# Patient Record
Sex: Male | Born: 1990 | Race: White | Hispanic: No | Marital: Married | Smoking: Current every day smoker
Health system: Southern US, Community
[De-identification: ages and names within clinical notes are randomized; demographics above are authoritative.]

## PROBLEM LIST (undated history)

## (undated) DIAGNOSIS — F319 Bipolar disorder, unspecified: Secondary | ICD-10-CM

---

## 2016-07-24 ENCOUNTER — Emergency Department (HOSPITAL_COMMUNITY)
Admission: EM | Admit: 2016-07-24 | Discharge: 2016-07-24 | Disposition: A | Discharge status: Home / Self Care | Payer: Managed Care, Other (non HMO) | Attending: Emergency Medicine | Admitting: Emergency Medicine

## 2016-07-24 ENCOUNTER — Encounter (HOSPITAL_COMMUNITY): Payer: Self-pay | Admitting: Emergency Medicine

## 2016-07-24 ENCOUNTER — Emergency Department (HOSPITAL_COMMUNITY): Payer: Managed Care, Other (non HMO)

## 2016-07-24 DIAGNOSIS — F17200 Nicotine dependence, unspecified, uncomplicated: Secondary | ICD-10-CM | POA: Insufficient documentation

## 2016-07-24 DIAGNOSIS — R079 Chest pain, unspecified: Secondary | ICD-10-CM | POA: Diagnosis not present

## 2016-07-24 DIAGNOSIS — R000 Tachycardia, unspecified: Secondary | ICD-10-CM

## 2016-07-24 HISTORY — DX: Bipolar disorder, unspecified: F31.9

## 2016-07-24 LAB — BASIC METABOLIC PANEL
ANION GAP: 8 (ref 5–15)
BUN: 15 mg/dL (ref 6–20)
CALCIUM: 9.9 mg/dL (ref 8.9–10.3)
CO2: 27 mmol/L (ref 22–32)
CREATININE: 1.47 mg/dL — AB (ref 0.61–1.24)
Chloride: 103 mmol/L (ref 101–111)
Glucose, Bld: 96 mg/dL (ref 65–99)
Potassium: 4.1 mmol/L (ref 3.5–5.1)
SODIUM: 138 mmol/L (ref 135–145)

## 2016-07-24 LAB — CBC
HCT: 45 % (ref 39.0–52.0)
HEMOGLOBIN: 15.5 g/dL (ref 13.0–17.0)
MCH: 31.2 pg (ref 26.0–34.0)
MCHC: 34.4 g/dL (ref 30.0–36.0)
MCV: 90.5 fL (ref 78.0–100.0)
PLATELETS: 172 10*3/uL (ref 150–400)
RBC: 4.97 MIL/uL (ref 4.22–5.81)
RDW: 12.7 % (ref 11.5–15.5)
WBC: 9.1 10*3/uL (ref 4.0–10.5)

## 2016-07-24 LAB — I-STAT TROPONIN, ED: TROPONIN I, POC: 0 ng/mL (ref 0.00–0.08)

## 2016-07-24 LAB — TROPONIN I: Troponin I: <0.03 ng/mL (ref <0.03)

## 2016-07-24 LAB — D-DIMER, QUANTITATIVE (NOT AT ARMC)

## 2016-07-24 MED ORDER — SODIUM CHLORIDE 0.9 % IV BOLUS (SEPSIS)
1000.0000 mL | Freq: Once | INTRAVENOUS | Status: AC
  Administered 2016-07-24: 1000 mL via INTRAVENOUS

## 2016-07-24 NOTE — ED Provider Notes (Signed)
I saw and evaluated the patient, reviewed the resident's note and I agree with the findings and plan.  Pertinent History: The patient is a 25 year old male, he has a history of anxiety, cocaine use and daily marijuana use, presents with chest pain which is been going on for the last 3 days, left-sided, seems to get better when he takes a deep breath worse when he positions himself in different positions, not associated with any other symptoms.  Pertinent Exam findings: On exam the patient is well-appearing, no distress, pulse of 70 when I walk into the room but within 30 seconds the patient's heart rate goes up to 120 in a sinus tachycardia, he states that he does have some underlying anxiety, abdomen is soft, no peripheral edema, well-appearing otherwise  I was personally present and directly supervised the following procedures:  I personally interpreted the EKG as well as the resident and agree with the interpretation on the resident's chart.   EKG Interpretation  Date/Time:  Friday July 24 2016 15:02:55 EDT Ventricular Rate:  134 PR Interval:  112 QRS Duration: 78 QT Interval:  298 QTC Calculation: 445 R Axis:   59 Text Interpretation:  Sinus tachycardia Otherwise normal ECG since last tracing no significant change Confirmed by Kerron Sedano  MD, Tranell Wojtkiewicz ([id number]) on 07/24/2016 7:49:53 PM        Final diagnoses:  Tachycardia  Nonspecific chest pain      Eber HongBrian Neema Fluegge, MD 07/25/16 1031

## 2016-07-24 NOTE — ED Triage Notes (Addendum)
Went to [facility]  Because he had high heart rate and bp  Sent for further tests and he had cp

## 2016-07-24 NOTE — ED Provider Notes (Signed)
[facility]-EMERGENCY DEPT Provider Note   CSN: [id number] Arrival date & time: 07/24/16  1452  First Provider Contact:  None       History   Chief Complaint Chief Complaint  Patient presents with  . Tachycardia  . Chest Pain    HPI Randall HissMarcus Croslin is a 25 y.o. male.  HPI Patient has history of bipolar on Seroquel presents for tachycardia. He reports some left-sided chest pain, sharp in nature, worse with deep breath over the past 4 days. It's off-and-on. It is nonexertional. There is some associated shortness of breath. Pain is currently a 2 out of 10. It's worse with pressing on the chest.  He went to urgent care today for the pain, was sent here due to tachycardia. He does report a history of cocaine use, last used one week ago, nightly marijuana use, daily cigarette use.  Past Medical History:  Diagnosis Date  . Bipolar 1 disorder (HCC)     There are no active problems to display for this patient.   History reviewed. No pertinent surgical history.     Home Medications    Prior to Admission medications   Medication Sig Start Date End Date Taking? Authorizing Provider  QUEtiapine (SEROQUEL XR) 400 MG 24 hr tablet Take 800 mg by mouth at bedtime. 07/22/16  Yes Historical Provider, MD    Family History No family history on file.  Social History Social History  Substance Use Topics  . Smoking status: Current Every Day Smoker  . Smokeless tobacco: Never Used  . Alcohol use Not on file     Allergies   Review of patient's allergies indicates no known allergies.   Review of Systems Review of Systems  Constitutional: Negative for chills and fever.  HENT: Negative for ear pain and sore throat.   Eyes: Negative for pain and visual disturbance.  Respiratory: Positive for shortness of breath. Negative for cough.   Cardiovascular: Positive for chest pain. Negative for palpitations and leg swelling.  Gastrointestinal: Negative for abdominal pain and vomiting.    Genitourinary: Negative for dysuria and hematuria.  Musculoskeletal: Negative for arthralgias and back pain.  Skin: Negative for color change and rash.  Neurological: Negative for seizures and syncope.  All other systems reviewed and are negative.    Physical Exam Updated Vital Signs BP 131/95   Pulse 93   Temp 99.5 F (37.5 C) (Oral)   Resp 16   SpO2 99%   Physical Exam  Constitutional: He appears well-developed and well-nourished.  HENT:  Head: Normocephalic and atraumatic.  Eyes: Conjunctivae are normal.  Neck: Neck supple.  Cardiovascular: Regular rhythm.  Tachycardia present.   No murmur heard. Pulmonary/Chest: Effort normal and breath sounds normal. No respiratory distress.  Abdominal: Soft. There is no tenderness.  Musculoskeletal: He exhibits no edema.  Neurological: He is alert.  Skin: Skin is warm and dry.  Psychiatric: He has a normal mood and affect.  Nursing note and vitals reviewed.    ED Treatments / Results  Labs (all labs ordered are listed, but only abnormal results are displayed) Labs Reviewed  BASIC METABOLIC PANEL - Abnormal; Notable for the following:       Result Value   Creatinine, Ser 1.47 (*)    All other components within normal limits  CBC  TROPONIN I  I-STAT TROPOININ, ED    EKG  EKG Interpretation None       Radiology Dg Chest 2 View  Result Date: 07/24/2016 CLINICAL DATA:  Left-sided chest pain  EXAM: CHEST  2 VIEW COMPARISON:  None. FINDINGS: The heart size and mediastinal contours are within normal limits. Both lungs are clear. The visualized skeletal structures are unremarkable. IMPRESSION: No active cardiopulmonary disease. Electronically Signed   By: Alcide Clever M.D.   On: 07/24/2016 15:42    Procedures Procedures (including critical care time)  Medications Ordered in ED Medications - No data to display   Initial Impression / Assessment and Plan / ED Course  I have reviewed the triage vital signs and the  nursing notes.  Pertinent labs & imaging results that were available during my care of the patient were reviewed by me and considered in my medical decision making (see chart for details).  Clinical Course    Patient presents for evaluation of chest pain.  HEART Pathway evaluation is low risk.  Troponin negative x2.  EKG is NSR.  Feel like PNA unlikely given CXR without infiltrates or effusion, no leukocytosis, no fever.  No mediastinal widening on CXR.  Doubt emergent intraabdominal pathology given benign abdominal exam and no abdominal symptoms.  Low risk Wells and d-dimer negative.   Impression is nonspecific chest pain  Final Clinical Impressions(s) / ED Diagnoses   Final diagnoses:  None    New Prescriptions New Prescriptions   No medications on file     Marcelina Morel, MD 07/24/16 2256    Eber Hong, MD 07/25/16 1031

## 2017-08-13 IMAGING — DX DG CHEST 2V
2 series · 2 of 2 positions shown · non-contrast
Comparison: None.

CLINICAL DATA: Left-sided chest pain

EXAM:
CHEST  2 VIEW

[chest pa]
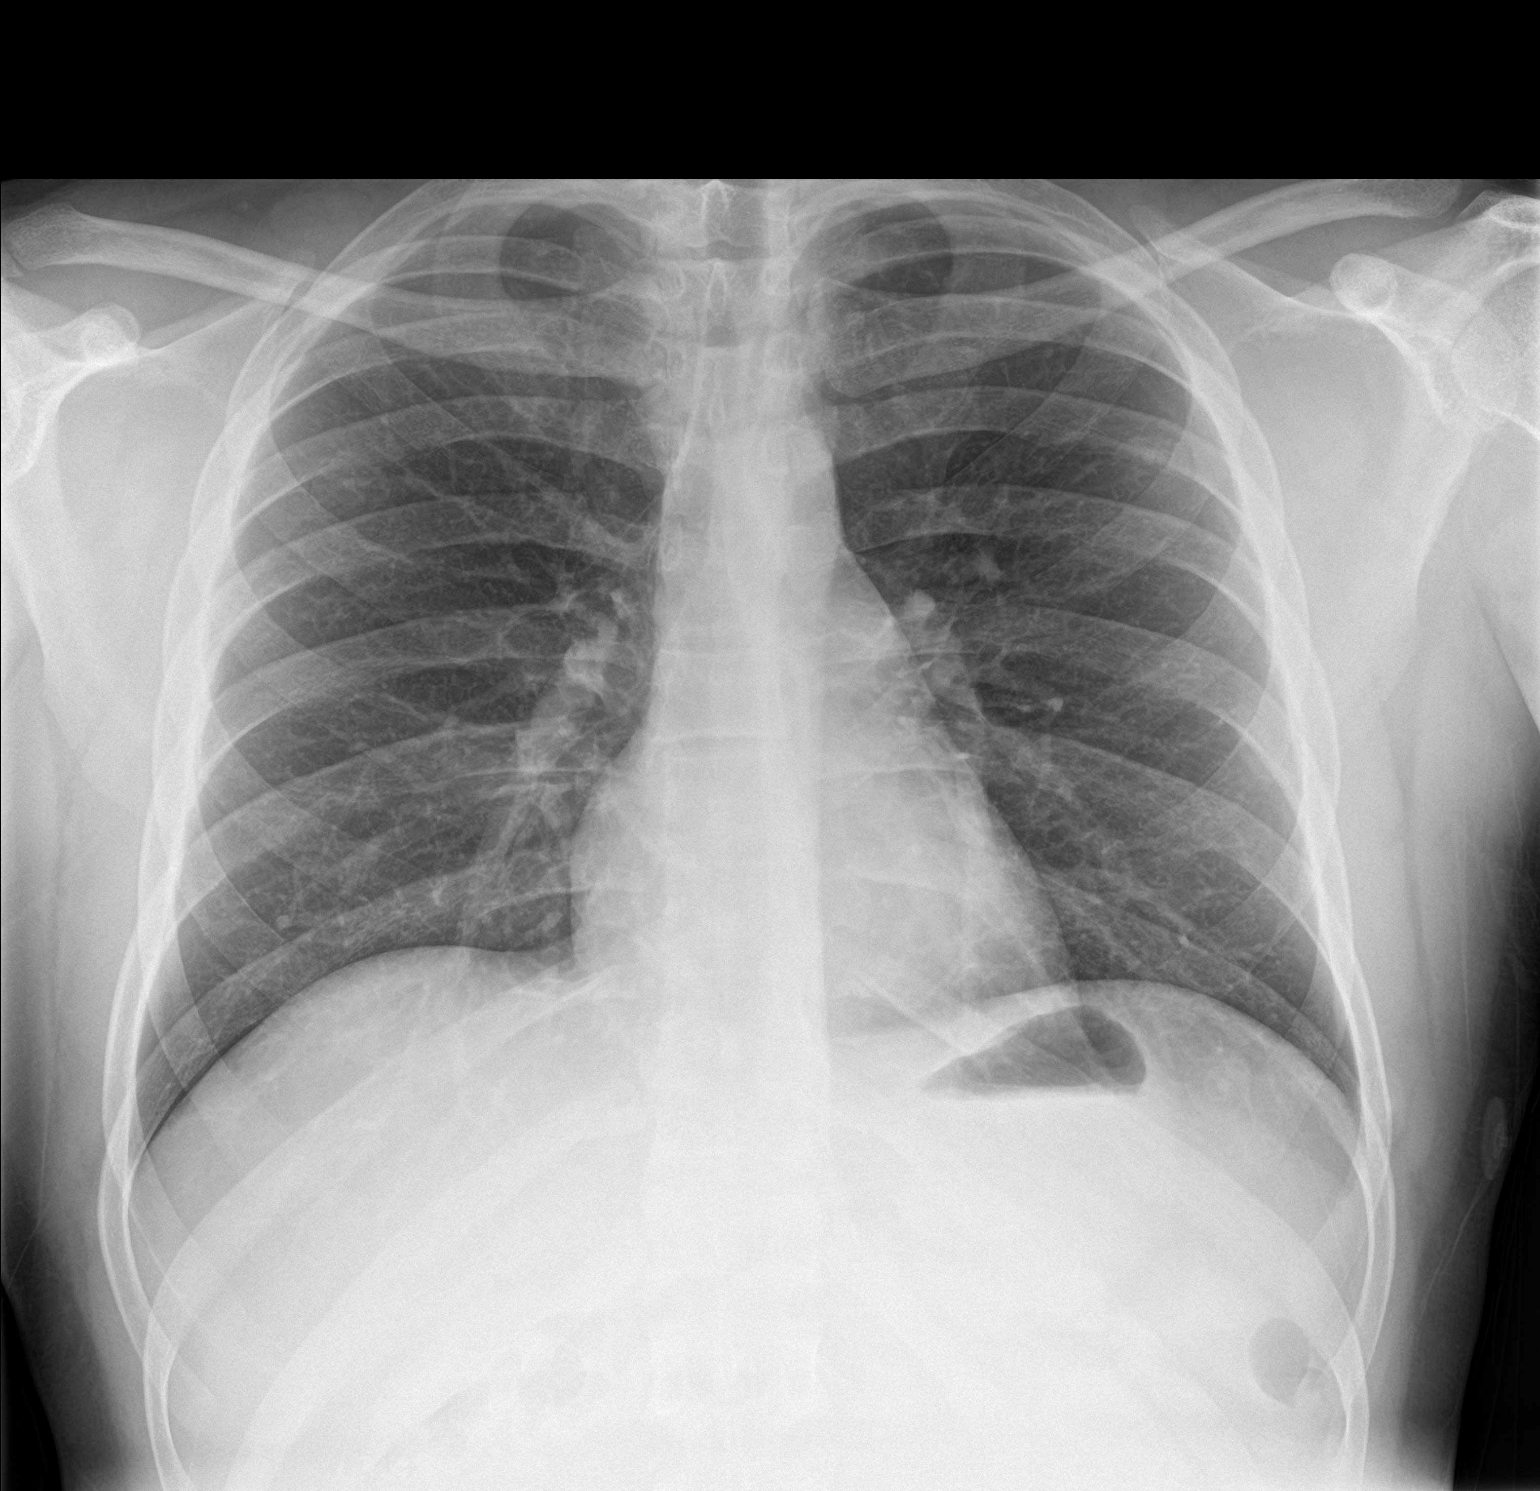

[chest lat]
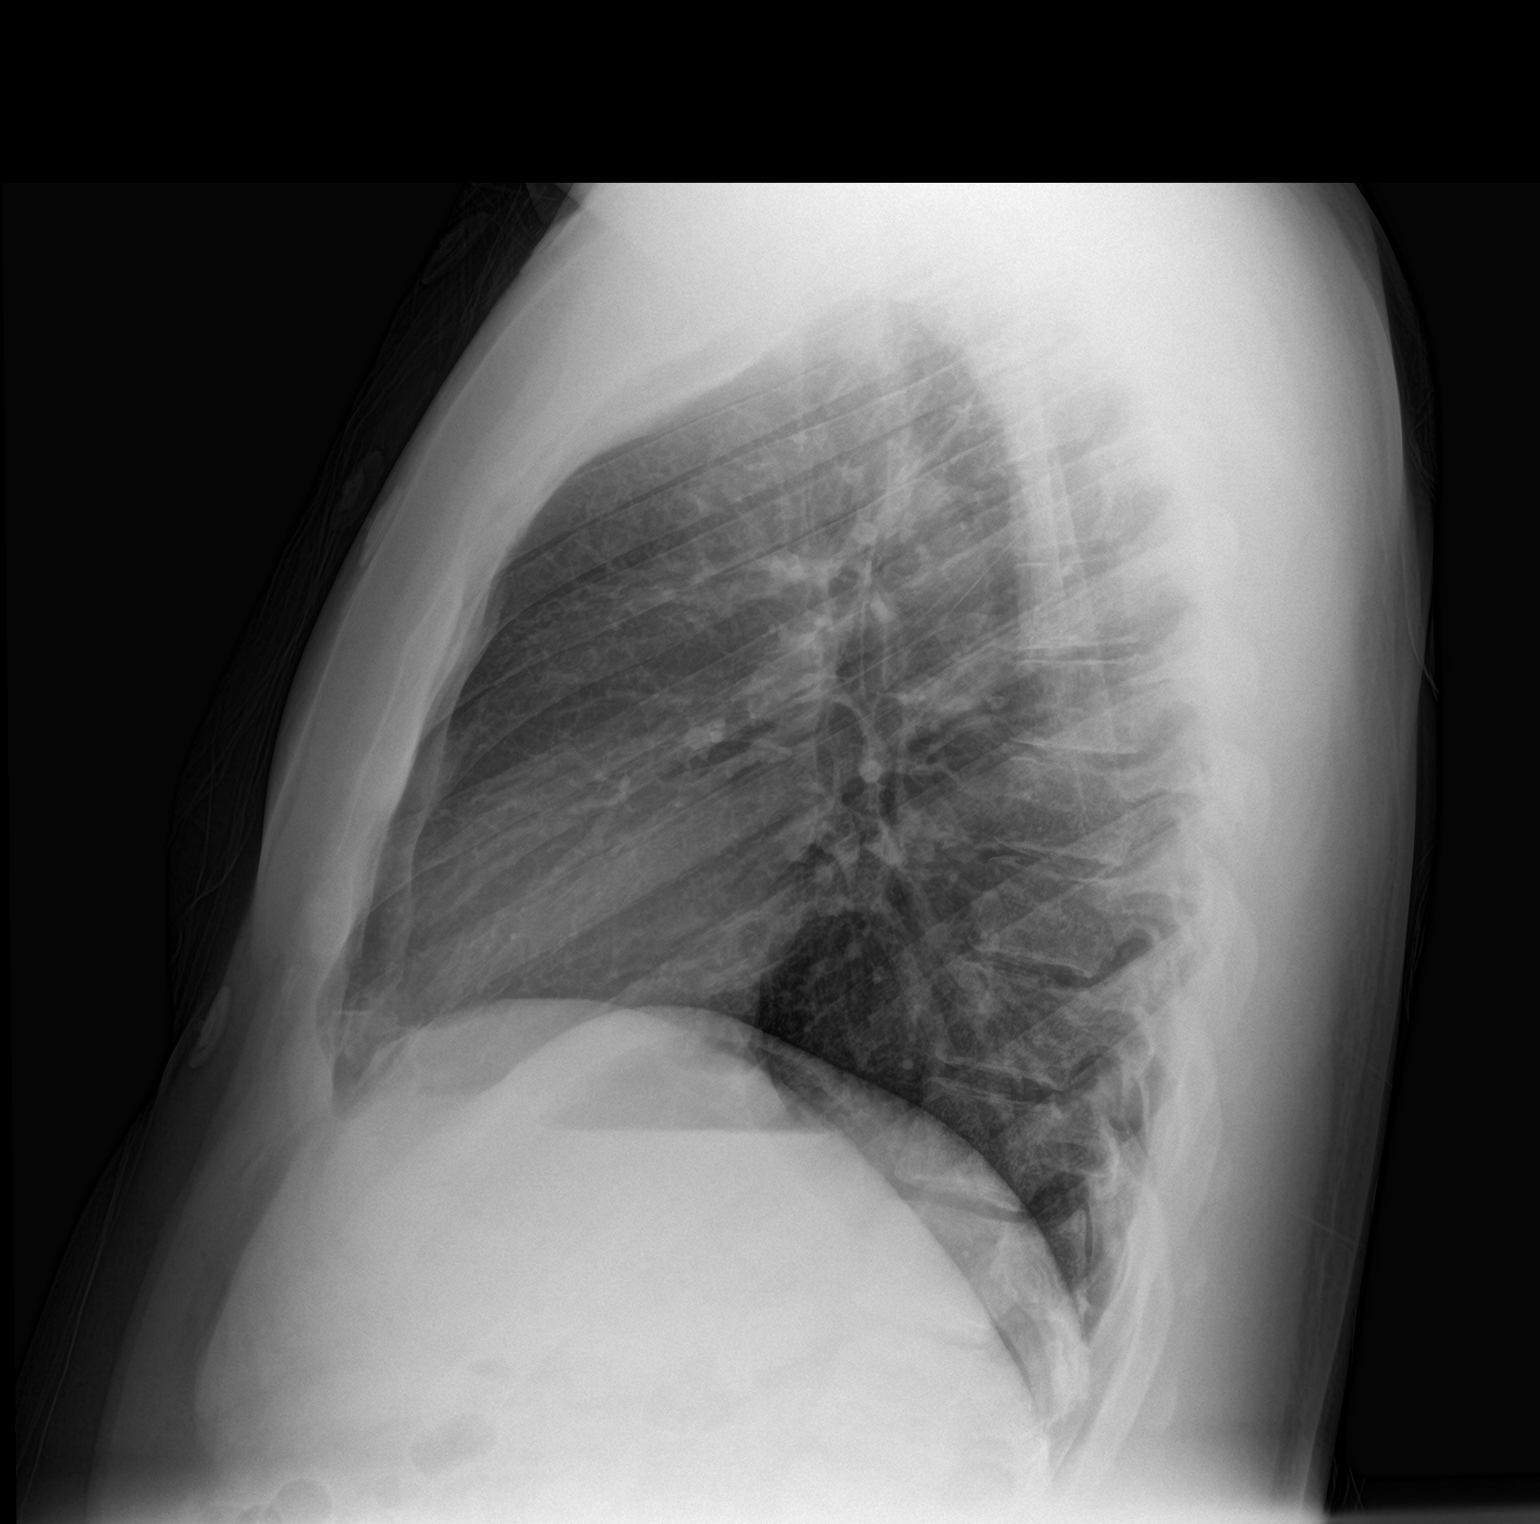

[2 of 2 positions shown; findings below may reference images not displayed]

FINDINGS: The heart size and mediastinal contours are within normal limits.
Both lungs are clear. The visualized skeletal structures are
unremarkable.
IMPRESSION: No active cardiopulmonary disease.
